# Patient Record
Sex: Female | Born: 1955 | Race: Black or African American | Hispanic: No | Smoking: Never smoker
Health system: Southern US, Community
[De-identification: ages and names within clinical notes are randomized; demographics above are authoritative.]

## PROBLEM LIST (undated history)

## (undated) DIAGNOSIS — E049 Nontoxic goiter, unspecified: Secondary | ICD-10-CM

## (undated) DIAGNOSIS — E079 Disorder of thyroid, unspecified: Secondary | ICD-10-CM

## (undated) DIAGNOSIS — R011 Cardiac murmur, unspecified: Secondary | ICD-10-CM

## (undated) DIAGNOSIS — R2240 Localized swelling, mass and lump, unspecified lower limb: Secondary | ICD-10-CM

## (undated) DIAGNOSIS — N915 Oligomenorrhea, unspecified: Secondary | ICD-10-CM

## (undated) HISTORY — DX: Disorder of thyroid, unspecified: E07.9

## (undated) HISTORY — DX: Nontoxic goiter, unspecified: E04.9

## (undated) HISTORY — PX: LIPOMA EXCISION: SHX5283

## (undated) HISTORY — DX: Oligomenorrhea, unspecified: N91.5

## (undated) HISTORY — DX: Cardiac murmur, unspecified: R01.1

## (undated) HISTORY — PX: TUBAL LIGATION: SHX77

## (undated) HISTORY — PX: WISDOM TOOTH EXTRACTION: SHX21

## (undated) HISTORY — DX: Localized swelling, mass and lump, unspecified lower limb: R22.40

---

## 2000-03-11 ENCOUNTER — Other Ambulatory Visit: Admission: RE | Admit: 2000-03-11 | Discharge: 2000-03-11 | Payer: Self-pay | Admitting: *Deleted

## 2001-06-04 ENCOUNTER — Other Ambulatory Visit: Admission: RE | Admit: 2001-06-04 | Discharge: 2001-06-04 | Payer: Self-pay | Admitting: Obstetrics and Gynecology

## 2002-07-05 ENCOUNTER — Other Ambulatory Visit: Admission: RE | Admit: 2002-07-05 | Discharge: 2002-07-05 | Payer: Self-pay | Admitting: Obstetrics and Gynecology

## 2003-07-07 ENCOUNTER — Other Ambulatory Visit: Admission: RE | Admit: 2003-07-07 | Discharge: 2003-07-07 | Payer: Self-pay | Admitting: Obstetrics and Gynecology

## 2005-10-15 ENCOUNTER — Other Ambulatory Visit: Admission: RE | Admit: 2005-10-15 | Discharge: 2005-10-15 | Payer: Self-pay | Admitting: Nephrology

## 2006-10-21 ENCOUNTER — Other Ambulatory Visit: Admission: RE | Admit: 2006-10-21 | Discharge: 2006-10-21 | Payer: Self-pay | Admitting: Nephrology

## 2012-05-13 ENCOUNTER — Other Ambulatory Visit: Payer: Self-pay | Admitting: Nephrology

## 2012-05-13 ENCOUNTER — Encounter: Payer: Self-pay | Admitting: Obstetrics and Gynecology

## 2012-05-13 ENCOUNTER — Ambulatory Visit (INDEPENDENT_AMBULATORY_CARE_PROVIDER_SITE_OTHER): Payer: BC Managed Care – PPO | Admitting: Obstetrics and Gynecology

## 2012-05-13 VITALS — BP 98/70 | HR 74 | Ht 65.5 in | Wt 154.0 lb

## 2012-05-13 DIAGNOSIS — E049 Nontoxic goiter, unspecified: Secondary | ICD-10-CM

## 2012-05-13 DIAGNOSIS — Z124 Encounter for screening for malignant neoplasm of cervix: Secondary | ICD-10-CM

## 2012-05-13 DIAGNOSIS — Z01419 Encounter for gynecological examination (general) (routine) without abnormal findings: Secondary | ICD-10-CM

## 2012-05-13 NOTE — Progress Notes (Signed)
Subjective:    Patricia Carson is a 56 y.o. female G7P5 who presents for annual exam. The patient has no complaints today.     Review of Systems Gastrointestinal:No change in bowel habits, no abdominal pain, no rectal bleeding Genitourinary:negative for abnormal vaginal bleeding,  dysuria, frequency, hematuria, nocturia and urinary incontinence.   Objective:     BP 98/70  Pulse 74  Ht 5' 5.5" (1.664 m)  Wt 154 lb (69.854 kg)  BMI 25.24 kg/m2 Weight:  Wt Readings from Last 1 Encounters:  05/13/12 154 lb (69.854 kg)   Body mass index is 25.24 kg/(m^2). General Appearance:  Well nourished in no acute distress HEENT: Grossly normal Neck / Thyroid: Supple, non-tender goiter Lungs: Clear to auscultation bilaterally Back: No CVA tenderness Breast Exam: No masses or nodes.No dimpling, nipple retraction or discharge. Cardiovascular: Regular rate and rhythm.  Gastrointestinal: Soft, non-tender, no masses or organomegaly Pelvic Exam: EGBUS-atrophic without lesions, cervix-no tenderness or lesions, uterus-appears normal size, shape and consistency, adnexae-no masses or tenderness Rectovaginal: Normal sphincter tone and  no masses  Lymphatic Exam: Non-palpable nodes in neck, clavicular, axillary, or inguinal regions Skin: No rash or abnormalities Extremities: no clubbing cyanosis or edema Neurologic: Grossly normal Psychiatric: Alert and oriented x 3    Assessment:   Routine GYN Exam  Goiter-Stable   Plan:   PAP sent  RTO 1 year or prn    Trust Leh,[email or web address]

## 2012-05-13 NOTE — Progress Notes (Signed)
Regular Periods: no Mammogram: yes  Monthly Breast Ex.: yes Exercise: yes  Tetanus < 10 years: yes Seatbelts: yes  NI. Bladder Functn.: yes Abuse at home: no  Daily BM's: yes Stressful Work: no  Healthy Diet: yes Sigmoid-Colonoscopy: 10/12  Calcium: no Medical problems this year: NO PROBLEMS   LAST PAP:7/12  NL  Contraception: BTL  Mammogram:  5/13   NL  PCP: DR. Bascom Levels  PMH: NO CHANGE  FMH: NO CHANGE  Last Bone Scan: NO  PT IS MARRIED

## 2012-05-14 LAB — PAP IG W/ RFLX HPV ASCU

## 2013-04-08 ENCOUNTER — Other Ambulatory Visit: Payer: Self-pay | Admitting: Physician Assistant

## 2013-04-08 DIAGNOSIS — Z1231 Encounter for screening mammogram for malignant neoplasm of breast: Secondary | ICD-10-CM

## 2013-07-06 DIAGNOSIS — E039 Hypothyroidism, unspecified: Secondary | ICD-10-CM | POA: Insufficient documentation

## 2013-07-06 DIAGNOSIS — E010 Iodine-deficiency related diffuse (endemic) goiter: Secondary | ICD-10-CM | POA: Insufficient documentation

## 2013-07-06 DIAGNOSIS — E119 Type 2 diabetes mellitus without complications: Secondary | ICD-10-CM | POA: Insufficient documentation

## 2013-09-06 ENCOUNTER — Ambulatory Visit: Payer: Self-pay

## 2014-07-03 ENCOUNTER — Encounter: Payer: Self-pay | Admitting: Obstetrics and Gynecology

## 2015-06-14 ENCOUNTER — Other Ambulatory Visit: Payer: Self-pay | Admitting: Endocrinology

## 2015-06-14 DIAGNOSIS — E049 Nontoxic goiter, unspecified: Secondary | ICD-10-CM

## 2015-06-21 ENCOUNTER — Ambulatory Visit
Admission: RE | Admit: 2015-06-21 | Discharge: 2015-06-21 | Disposition: A | Discharge status: Home / Self Care | Payer: BLUE CROSS/BLUE SHIELD | Source: Ambulatory Visit | Attending: Endocrinology | Admitting: Endocrinology

## 2015-06-21 DIAGNOSIS — E049 Nontoxic goiter, unspecified: Secondary | ICD-10-CM

## 2015-06-27 ENCOUNTER — Other Ambulatory Visit: Payer: Self-pay | Admitting: Endocrinology

## 2015-06-27 DIAGNOSIS — E041 Nontoxic single thyroid nodule: Secondary | ICD-10-CM

## 2015-07-04 ENCOUNTER — Inpatient Hospital Stay: Admission: RE | Admit: 2015-07-04 | Payer: BLUE CROSS/BLUE SHIELD | Source: Ambulatory Visit

## 2015-07-04 ENCOUNTER — Other Ambulatory Visit: Payer: Self-pay

## 2015-07-05 ENCOUNTER — Other Ambulatory Visit (HOSPITAL_COMMUNITY)
Admission: RE | Admit: 2015-07-05 | Discharge: 2015-07-05 | Disposition: A | Discharge status: Home / Self Care | Payer: BLUE CROSS/BLUE SHIELD | Source: Ambulatory Visit | Attending: Endocrinology | Admitting: Endocrinology

## 2015-07-05 ENCOUNTER — Other Ambulatory Visit (HOSPITAL_COMMUNITY)
Admission: RE | Admit: 2015-07-05 | Discharge: 2015-07-05 | Disposition: A | Discharge status: Home / Self Care | Payer: BLUE CROSS/BLUE SHIELD | Source: Ambulatory Visit | Attending: Radiology | Admitting: Radiology

## 2015-07-05 ENCOUNTER — Ambulatory Visit
Admission: RE | Admit: 2015-07-05 | Discharge: 2015-07-05 | Disposition: A | Discharge status: Home / Self Care | Payer: BLUE CROSS/BLUE SHIELD | Source: Ambulatory Visit | Attending: Endocrinology | Admitting: Endocrinology

## 2015-07-05 DIAGNOSIS — E041 Nontoxic single thyroid nodule: Secondary | ICD-10-CM | POA: Insufficient documentation

## 2016-05-08 ENCOUNTER — Other Ambulatory Visit: Payer: Self-pay | Admitting: Endocrinology

## 2016-05-08 DIAGNOSIS — E049 Nontoxic goiter, unspecified: Secondary | ICD-10-CM

## 2016-05-16 ENCOUNTER — Ambulatory Visit
Admission: RE | Admit: 2016-05-16 | Discharge: 2016-05-16 | Disposition: A | Discharge status: Home / Self Care | Payer: BLUE CROSS/BLUE SHIELD | Source: Ambulatory Visit | Attending: Endocrinology | Admitting: Endocrinology

## 2016-05-16 DIAGNOSIS — E049 Nontoxic goiter, unspecified: Secondary | ICD-10-CM

## 2016-09-09 ENCOUNTER — Ambulatory Visit: Payer: Self-pay | Admitting: Surgery

## 2016-10-10 ENCOUNTER — Ambulatory Visit: Payer: BLUE CROSS/BLUE SHIELD | Admitting: Family Medicine

## 2016-10-10 ENCOUNTER — Ambulatory Visit (INDEPENDENT_AMBULATORY_CARE_PROVIDER_SITE_OTHER): Payer: BLUE CROSS/BLUE SHIELD | Admitting: Family Medicine

## 2016-10-10 ENCOUNTER — Encounter: Payer: Self-pay | Admitting: Family Medicine

## 2016-10-10 VITALS — BP 110/78 | HR 70 | Resp 12 | Ht 65.5 in | Wt 147.5 lb

## 2016-10-10 DIAGNOSIS — E049 Nontoxic goiter, unspecified: Secondary | ICD-10-CM | POA: Diagnosis not present

## 2016-10-10 DIAGNOSIS — R011 Cardiac murmur, unspecified: Secondary | ICD-10-CM

## 2016-10-10 DIAGNOSIS — E119 Type 2 diabetes mellitus without complications: Secondary | ICD-10-CM | POA: Diagnosis not present

## 2016-10-10 NOTE — Progress Notes (Signed)
Pre visit review using our clinic review tool, if applicable. No additional management support is needed unless otherwise documented below in the visit note. 

## 2016-10-10 NOTE — Patient Instructions (Signed)
A few things to remember from today's visit:   Type 2 diabetes mellitus without complication, without long-term current use of insulin (HCC)  Goiter  You can continue following with Dr Talmage NapBalan in 05/2017 and I will see you in 10/2017, so you are following for diabetes q 6 months.  Aspirin 81 mg. Medications like Lisinopril for kidney protection and statins for cardiovascular protection are recommended for patients with diabetes, so may be considered later.   Please be sure medication list is accurate. If a new problem present, please set up appointment sooner than planned today.

## 2016-10-10 NOTE — Progress Notes (Signed)
HPI:   [name] is a 61 y.o. female, who is here today as a new patients. She tells me that she needs a "second opinion" on goiter.   She is patient of  Dr Dareen PianoAnderson, who she saw 12/221/2017 for her routine CPE. According to pt, it was her husband who arranged this appt.  She also follows with gyn for her routine gyn preventive care.  -According to pt, she saw general surgeon earlier this year and was told that surgery was not medically necessary but it was "up to me." She denies dysphagia,odynophagia,limitation cervical ROM, cervicalgia,or stridor. It seems to be stable in regard to size. TSH 08/21/16 normal at 0.7.  Thyroid U/S done 05/17/16:  1. Stable 3 cm inferior right solid and cystic nodule. This was previously sampled and demonstrated benign cytology. This nodule does not meet TI-RADS criteria for biopsy or imaging follow-up. 2. No reproducible 4 cm right mid thyroid nodule. This was likely a pseudo nodule on prior ultrasound. 3. Additional measured 3 cm right mid thyroid nodule on today's study, not previously measured. This may also represent a pseudo nodule and follow-up ultrasound is recommended in 12 months.  07/05/15 Fine needle Bx of thyroid nodule: Benign follicular nodule.  She tells me that her sister also had same problem and she underwent thyroidectomy.   -She has Hx of DM II, she is currently on Metformin 500 mg bid. Last HgA1C 08/2016 was 6.3. Denies severe/frequent headache, visual changes, chest pain, dyspnea, palpitation, claudication, focal weakness, or edema.  She does not exercise regularly but is active in general. She tries to follow a healthy diet. She does not check her BS's regularly.  Tolerating medications well. She denies abdominal pain, nausea, vomiting, polydipsia, polyuria, or polyphagia. No numbness, tingling, or burning.Occasinally "pin" sensation right calf first thing in the morning while she is still in bed, last a few  seconds.   She sees Dr Talmage NapBalan, endocrinologists, annually, next appt 05/2017.   Review of Systems  Constitutional: Negative for appetite change, fatigue, fever and unexpected weight change.  HENT: Negative for mouth sores, nosebleeds, sore throat, trouble swallowing and voice change.   Eyes: Negative for pain, redness and visual disturbance.  Respiratory: Negative for cough, shortness of breath, wheezing and stridor.   Cardiovascular: Negative for chest pain, palpitations and leg swelling.  Gastrointestinal: Negative for abdominal pain, nausea and vomiting.       Negative for changes in bowel habits.  Endocrine: Negative for cold intolerance and heat intolerance.  Genitourinary: Negative for decreased urine volume, dysuria and hematuria.  Musculoskeletal: Negative for gait problem and myalgias.  Skin: Negative for rash.  Neurological: Negative for syncope, weakness, numbness and headaches.  Psychiatric/Behavioral: Negative for confusion. The patient is not nervous/anxious.       Current Outpatient Prescriptions on File Prior to Visit  Medication Sig Dispense Refill  . metFORMIN (GLUCOPHAGE) 500 MG tablet TAKE TWO TABLETS BY MOUTH IN THE MORNING WITH A MEAL AND TWO TABLETS IN THE EVENING MEAL     No current facility-administered medications on file prior to visit.      Past Medical History:  Diagnosis Date  . Goiter   . Goiter   . Mass of thigh    left  . Murmur   . Oligomenorrhea   . Thyroid disease    thyromegaly    No Known Allergies  No family history on file.  Social History   Social History  . Marital status:  Divorced    Spouse name: N/A  . Number of children: N/A  . Years of education: N/A   Social History Main Topics  . Smoking status: Never Smoker  . Smokeless tobacco: Never Used  . Alcohol use No  . Drug use: No  . Sexual activity: Yes    Birth control/ protection: Surgical     Comment: BTL   Other Topics Concern  . None   Social History  Narrative  . None    Vitals:   10/10/16 1032  BP: 110/78  Pulse: 70  Resp: 12   O2 sat 98% at RA. Body mass index is 24.17 kg/m.   Physical Exam  Nursing note and vitals reviewed. Constitutional: She is oriented to person, place, and time. She appears well-developed and well-nourished. No distress.  HENT:  Head: Atraumatic.  Mouth/Throat: Oropharynx is clear and moist and mucous membranes are normal.  Eyes: Conjunctivae and EOM are normal. Pupils are equal, round, and reactive to light.  Neck: No tracheal deviation present. Thyromegaly present.  Cardiovascular: Normal rate and regular rhythm.   Murmur (SEM I/VI RUSB and LUSB) heard. Pulses:      Dorsalis pedis pulses are 2+ on the right side, and 2+ on the left side.  Respiratory: Effort normal and breath sounds normal. No respiratory distress.  GI: Soft. She exhibits no mass. There is no hepatomegaly. There is no tenderness.  Musculoskeletal: She exhibits no edema or tenderness.  Lymphadenopathy:    She has no cervical adenopathy.  Neurological: She is alert and oriented to person, place, and time. She has normal strength. Coordination and gait normal.  Skin: Skin is warm. No erythema.  Psychiatric: She has a normal mood and affect.  Well groomed, good eye contact.      ASSESSMENT AND PLAN:   Patricia Carson was seen today for establish care.  Diagnoses and all orders for this visit:  Goiter  We reviewed last thyroid u/s, pathology results and discussed disease, treatment ,and prognosis. She states that she would like to have thyroidectomy but can not afford the deductible. She was reassured. Monitor for growth or signs of complications. Continue following with Dr Talmage Nap.  Type 2 diabetes mellitus without complication, without long-term current use of insulin (HCC)  Well controlled. No changes in current management. Dental,eye,and foot care discussed and recommended. Continue following annually with Dr Talmage Nap and  with Dr Dareen Piano.  Heart murmur  Soft SEM was heard today, educated about possible etiologies, most likely benign.  She is not sure if she has prior Hx. She is asymptomatic. No further recommendations today, will follow with Dr Dareen Piano, instructed about warning signs.     Zephyr Sausedo G. [place], MD  [facility]. [place] office.

## 2016-12-02 IMAGING — US US THYROID
1 series · 12 of 25 positions shown · non-contrast
Comparison: 06/21/2015

CLINICAL DATA: Prior ultrasound follow-up. Multinodular thyroid
goiter. Prior needle aspirate sampling of right mid and inferior
thyroid nodules on 07/05/2015 with both demonstrating evidence of
benign follicular nodules.

EXAM:
THYROID ULTRASOUND
TECHNIQUE: Ultrasound examination of the thyroid gland and adjacent soft
tissues was performed.

[Series 1: us thyroid · 0.08mm/px · 12 of 66 slices shown]
[im 3/66]
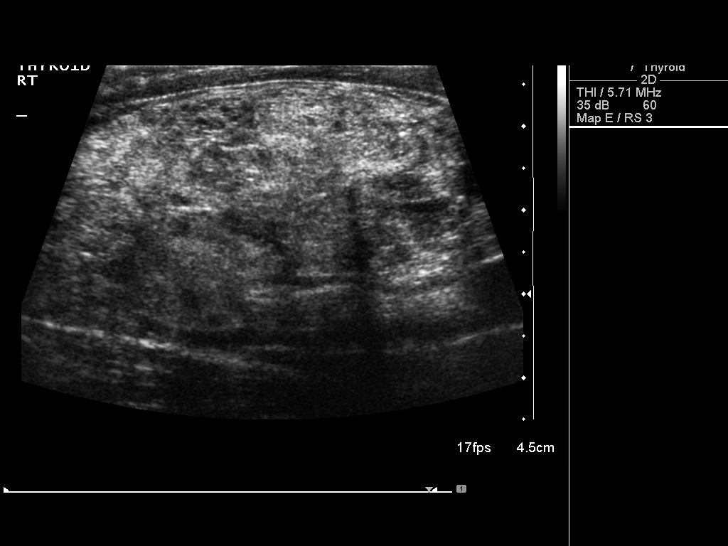
[im 9/66]
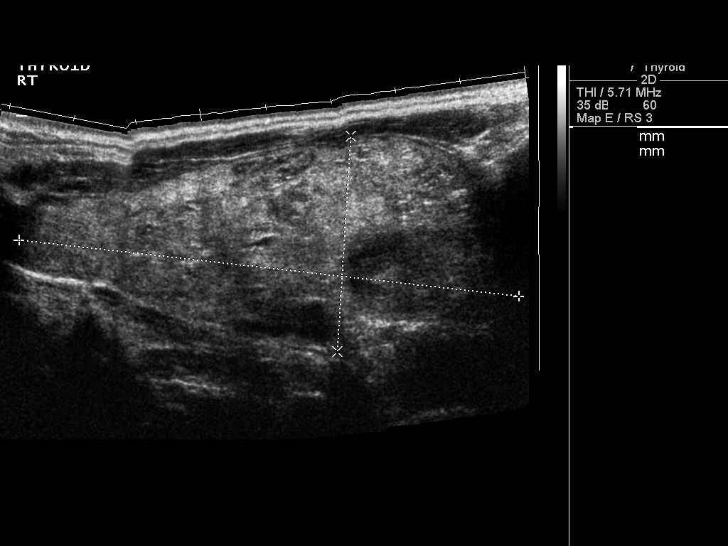
[im 14/66]
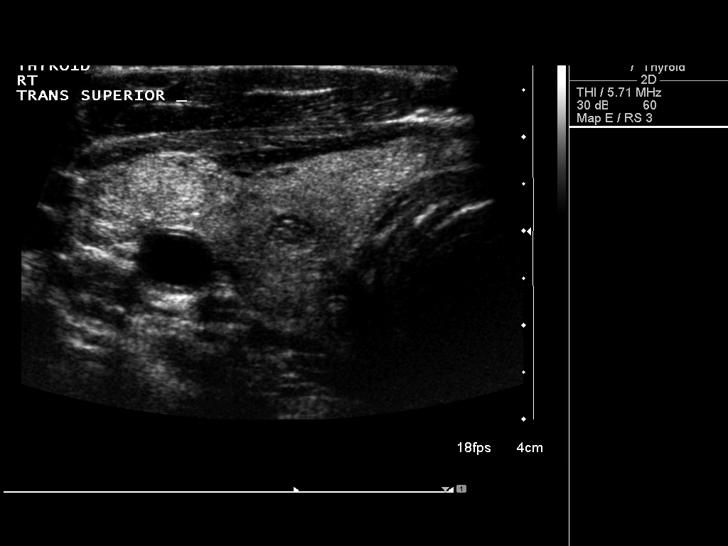
[im 19/66]
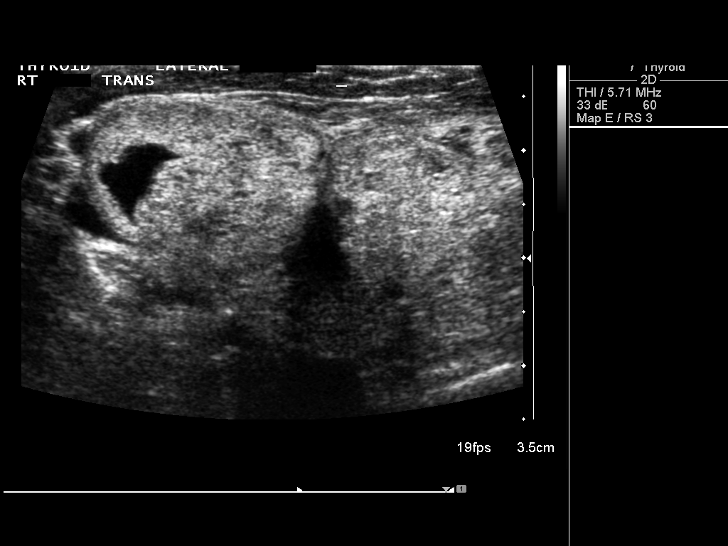
[im 25/66]
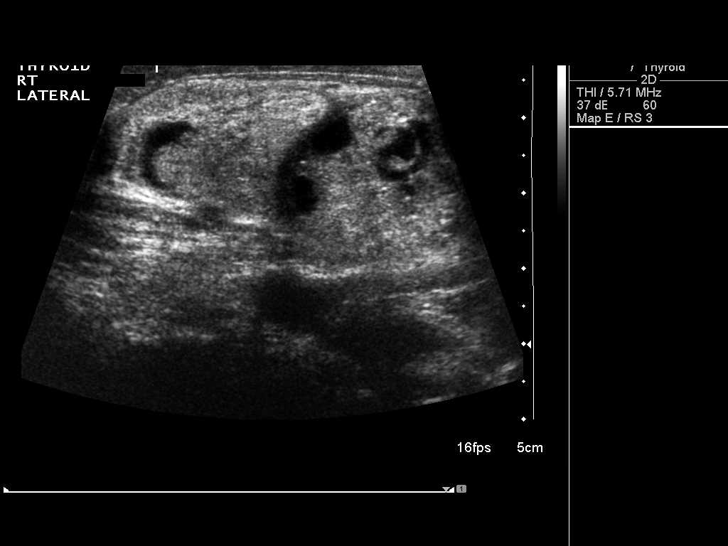
[im 30/66]
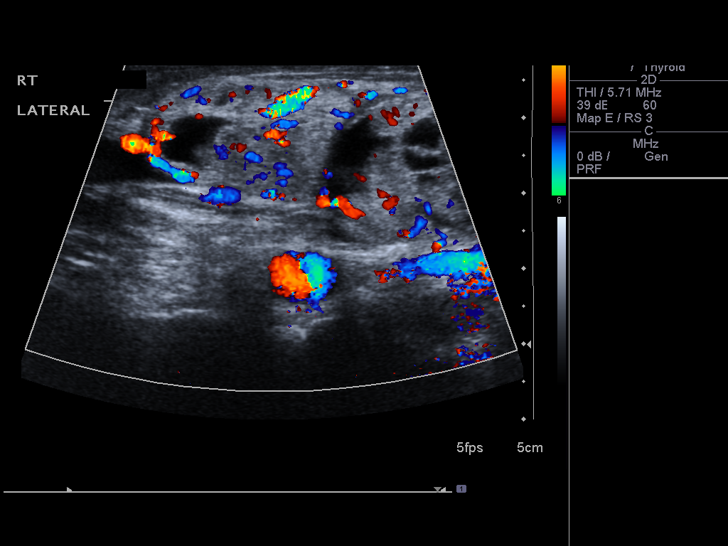
[im 36/66]
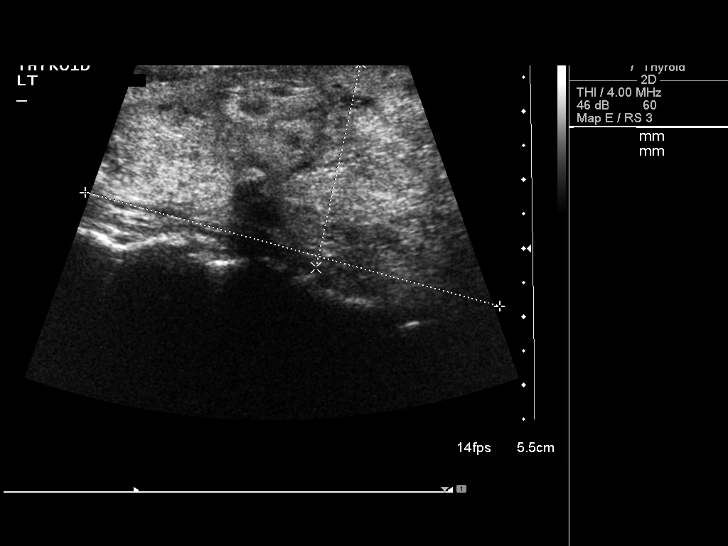
[im 41/66]
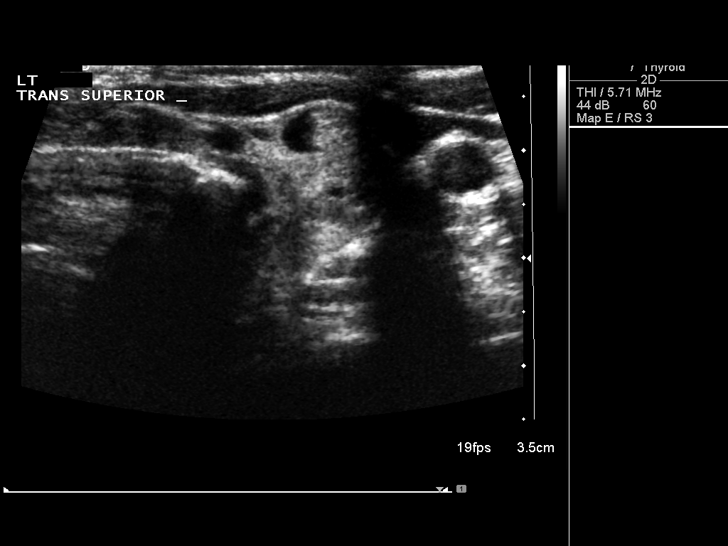
[im 47/66]
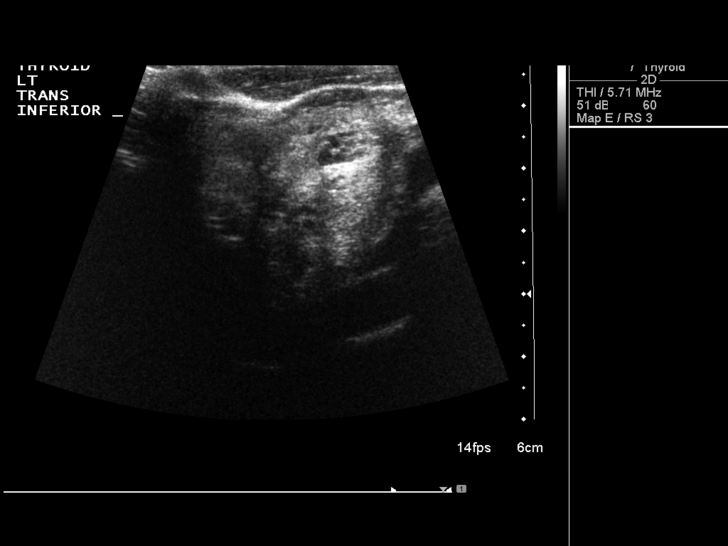
[im 52/66]
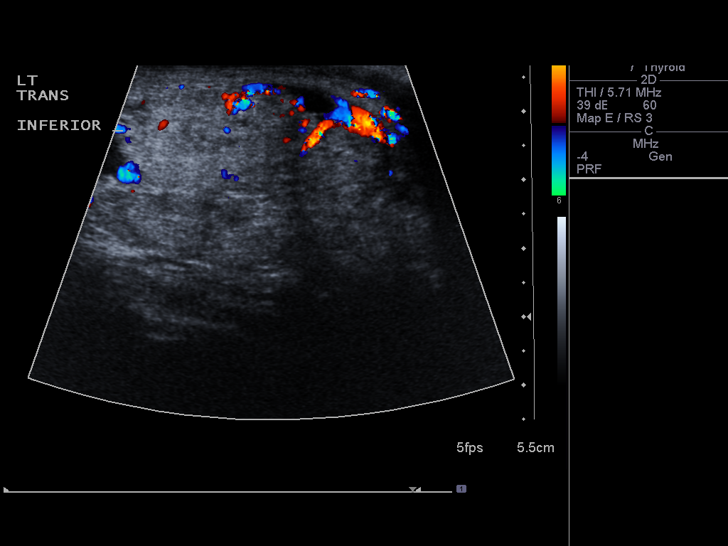
[im 57/66]
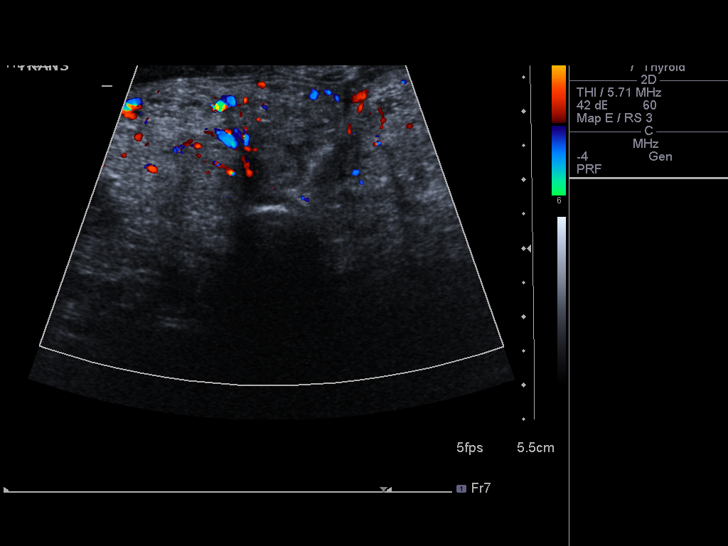
[im 63/66]
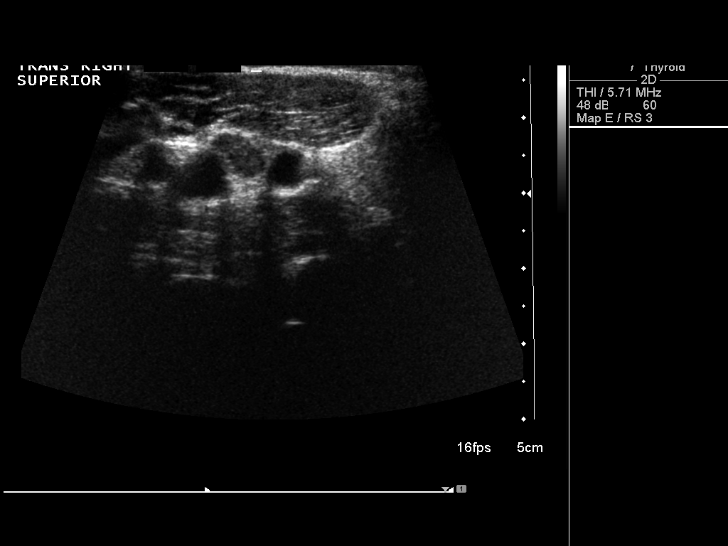

[12 of 25 positions shown; findings below may reference images not displayed]

FINDINGS: Parenchymal Echotexture: Markedly heterogenous

Estimated total number of nodules >/= 1 cm: 2

Number of spongiform nodules > 2 cm not described below (TR1): 0

Number of mixed cystic and solid nodules > 1.5 cm not described
below (TR2): 0

_________________________________________________________

Isthmus: 1.8 cm

No discrete nodules are identified within the thyroid isthmus.

_________________________________________________________

Right lobe: 9.0 x 3.1 x 5.0 cm

Nodule # 1:

Prior biopsy: Yes

Location: Right; Inferior

Size: 3.2 x 2.3 x 2.8 cm (previously 3.1 x 2.3 x 2.7 cm).

Composition: mixed cystic and solid (1)

Echogenicity: isoechoic (1)

Shape: not taller-than-wide (0)

Margins: smooth (0)

Echogenic foci: none (0)

ACR TI-RADS total points: 2.

ACR TI-RADS risk category: TR2 (2 points).

Change in features: No

Change in ACR TI-RADS risk category: No

ACR TI-RADS recommendations:

This nodule does NOT meet TI-RADS criteria for biopsy or dedicated
follow-up.

Nodule # 2:

Location: Right; Mid

Size: 3.2 x 01.6 x 2.2 cm (previously not measured)

Composition: solid/almost completely solid (2)

Echogenicity: isoechoic (1)

Shape: not taller-than-wide (0)

Margins: smooth (0)

Echogenic foci: none (0)

ACR TI-RADS total points: 3.

ACR TI-RADS risk category: TR3 (3 points).

ACR TI-RADS recommendations:

This nodule was previously not measured. Based on ultrasound
appearance, this may represent a pseudo nodule. Reasonable approach
would be a follow-up ultrasound in 12 months.

No reproducible 4 cm mid right thyroid nodule as previously
measured. The superior right lobe is heterogeneous and previous
findings are felt to relate to a pseudo nodule.

_________________________________________________________

Left lobe: 9.6 x 3.9 x 5.6 cm

Diffuse heterogeneous left lobe without discrete nodules. Focal
shadowing calcification in the superior to mid left lobe was present
previously and is consistent with dystrophic macroscopic
calcification.
IMPRESSION: 1. Stable 3 cm inferior right solid and cystic nodule. This was
previously sampled and demonstrated benign cytology. This nodule
does not meet TI-RADS criteria for biopsy or imaging follow-up.
2. No reproducible 4 cm right mid thyroid nodule. This was likely a
pseudo nodule on prior ultrasound.
3. Additional measured 3 cm right mid thyroid nodule on today's
study, not previously measured. This may also represent a pseudo
nodule and follow-up ultrasound is recommended in 12 months.
The above is in keeping with the ACR TI-RADS recommendations - [HOSPITAL] 1377;[DATE].

## 2017-05-08 ENCOUNTER — Other Ambulatory Visit: Payer: Self-pay | Admitting: Endocrinology

## 2017-05-08 DIAGNOSIS — E049 Nontoxic goiter, unspecified: Secondary | ICD-10-CM

## 2017-05-14 ENCOUNTER — Ambulatory Visit
Admission: RE | Admit: 2017-05-14 | Discharge: 2017-05-14 | Disposition: A | Discharge status: Home / Self Care | Payer: BLUE CROSS/BLUE SHIELD | Source: Ambulatory Visit | Attending: Endocrinology | Admitting: Endocrinology

## 2017-05-14 DIAGNOSIS — E049 Nontoxic goiter, unspecified: Secondary | ICD-10-CM

## 2017-05-20 ENCOUNTER — Other Ambulatory Visit: Payer: Self-pay | Admitting: Endocrinology

## 2017-05-20 DIAGNOSIS — E049 Nontoxic goiter, unspecified: Secondary | ICD-10-CM

## 2017-06-09 ENCOUNTER — Ambulatory Visit
Admission: RE | Admit: 2017-06-09 | Discharge: 2017-06-09 | Disposition: A | Discharge status: Home / Self Care | Payer: BLUE CROSS/BLUE SHIELD | Source: Ambulatory Visit | Attending: Endocrinology | Admitting: Endocrinology

## 2017-06-09 ENCOUNTER — Other Ambulatory Visit: Payer: Self-pay | Admitting: Endocrinology

## 2017-06-09 DIAGNOSIS — E049 Nontoxic goiter, unspecified: Secondary | ICD-10-CM

## 2017-12-26 IMAGING — US US THYROID
1 series · 4 of 4 positions shown · non-contrast
Comparison: Ultrasound 05/14/2017

CLINICAL DATA: Thyroid nodules. Possible TR 4 (moderately
suspicious) mid left nodule versus pseudonodule noted on recent
ultrasound. Biopsy requested.

EXAM:
THYROID ULTRASOUND LIMITED
TECHNIQUE: Directed Ultrasound examination of the left lobe of the thyroid in
contemplation of percutaneous FNA biopsy.

[Series 1: us thyroid · 4 acquisitions, 4 frames shown]
[im 1/4]
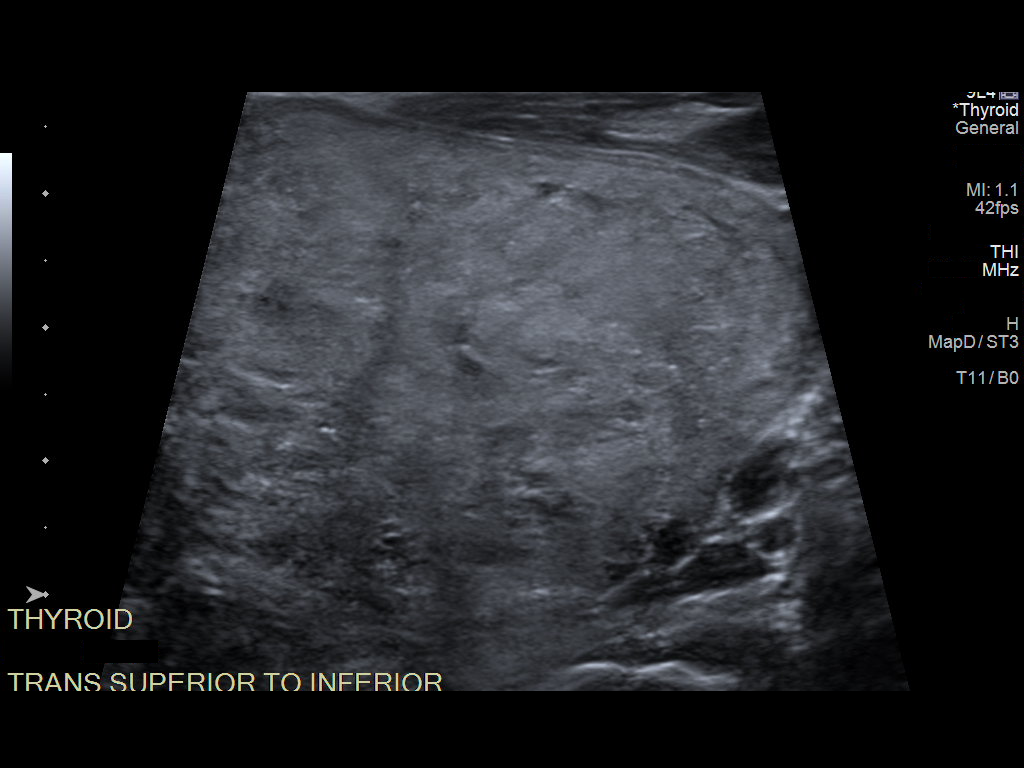
[im 2/4]
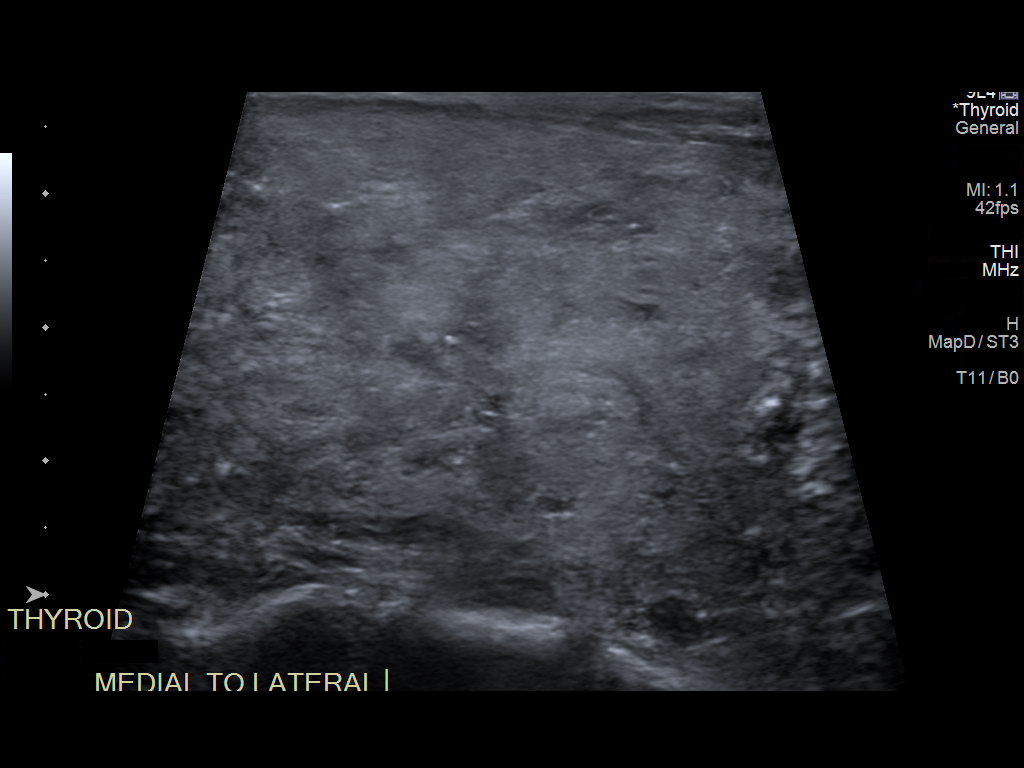
[im 3/4]
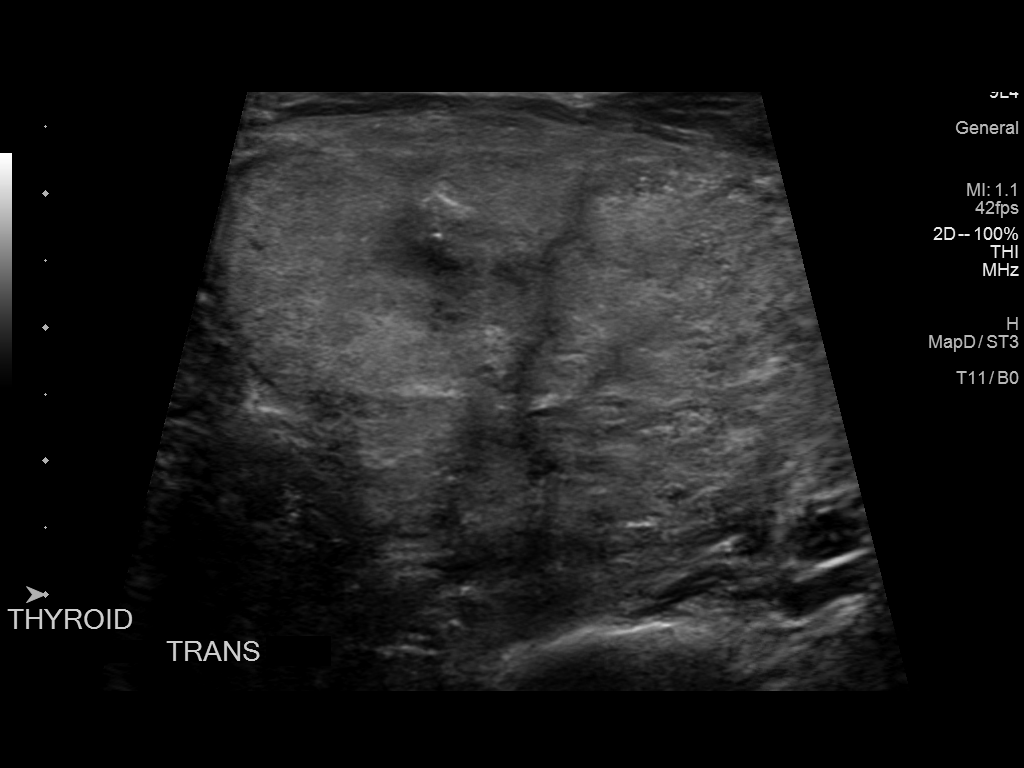
[im 4/4]
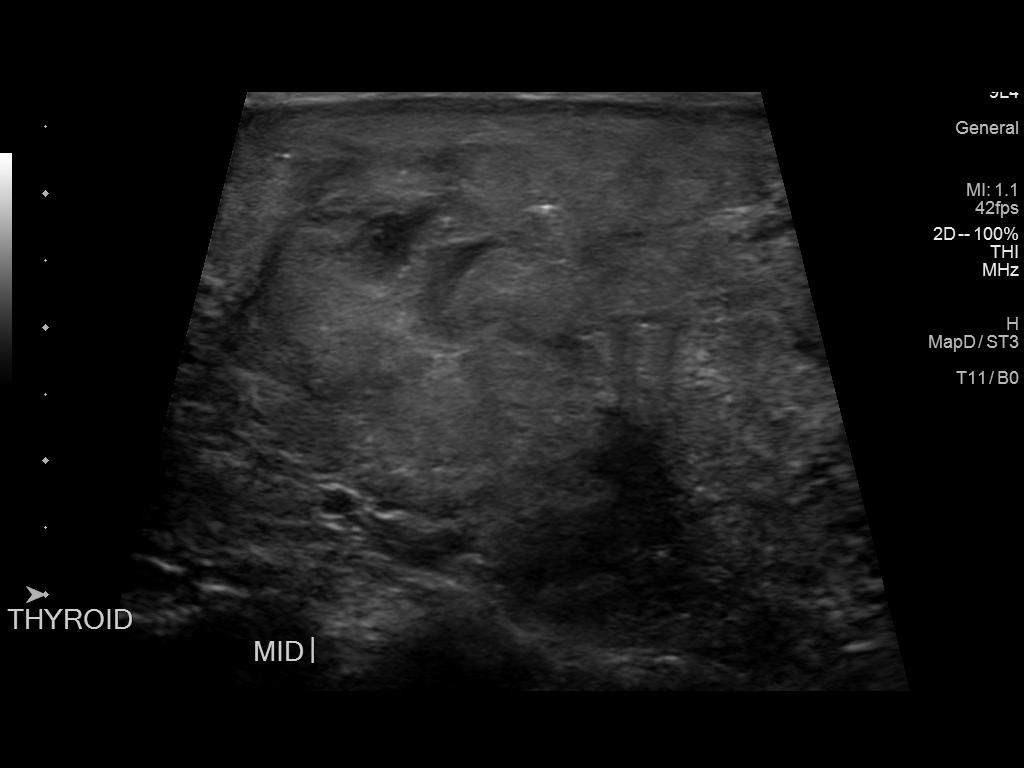

[4 of 4 positions shown; findings below may reference images not displayed]

FINDINGS: Parenchyma is markedly heterogeneous and lobular. No discrete lesion
in the mid left lobe is confirmed to correspond to the region
suspected on prior static images, consistent with pseudonodule.
Biopsy therefore deferred.
IMPRESSION: 1. No discrete mid left nodule corresponding to area of concern on
previous ultrasound. Findings probably pseudo nodule. Biopsy
deferred.

Consider 1-2 year follow-up surveillance ultrasound as previously
recommended.

The above is in keeping with the ACR TI-RADS recommendations - [HOSPITAL] 9075;[DATE].

## 2018-03-21 IMAGING — US US THYROID
1 series · 12 of 25 positions shown · non-contrast
Comparison: 07/05/2015, 05/16/2016

CLINICAL DATA: Goiter.

EXAM:
THYROID ULTRASOUND
TECHNIQUE: Ultrasound examination of the thyroid gland and adjacent soft
tissues was performed.

[Series 1: us thyroid · 0.11mm/px · 90 acquisitions, 12 frames shown]
[im 4/90]
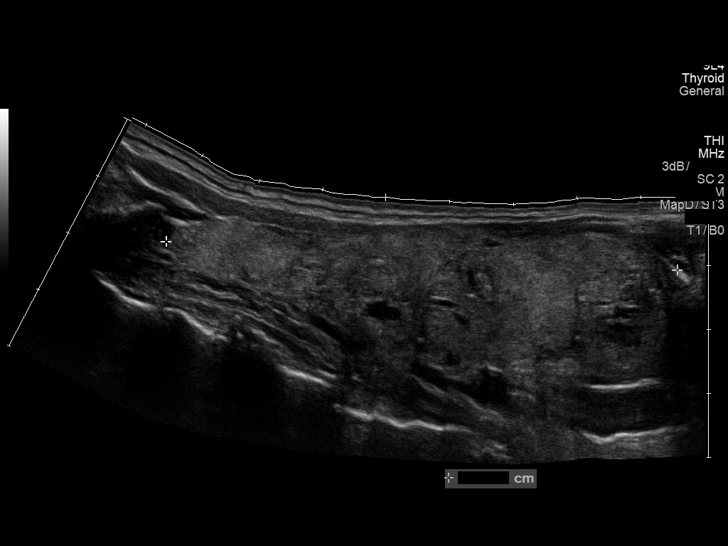
[im 12/90]
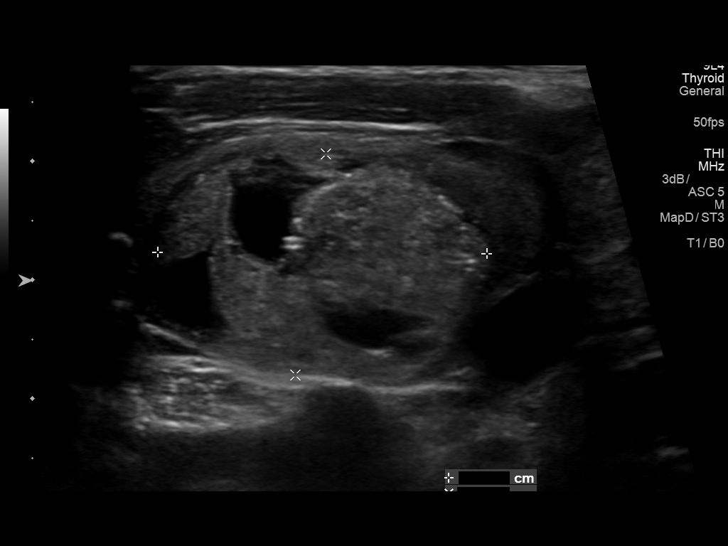
[im 19/90]
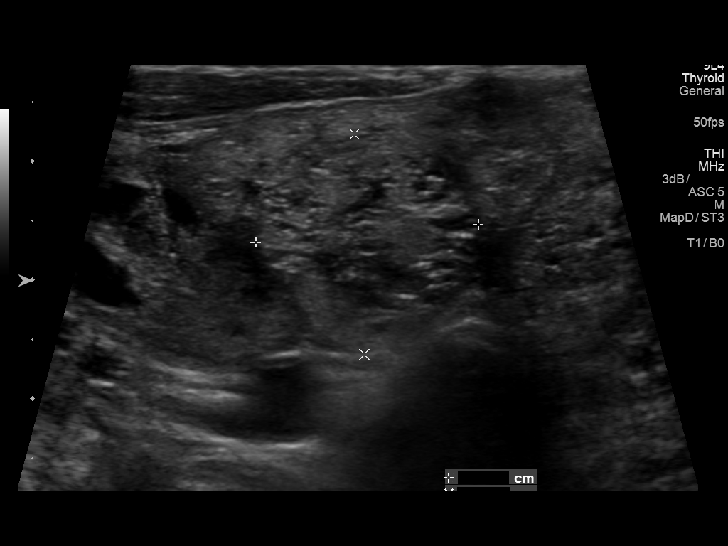
[im 26/90]
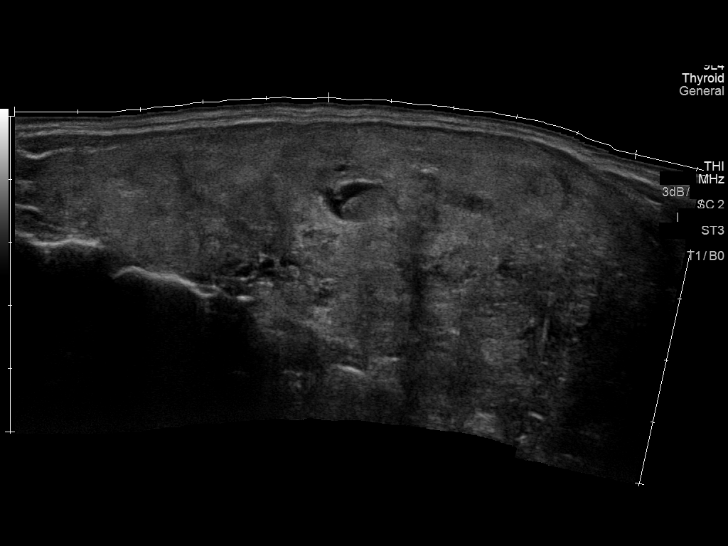
[im 34/90]
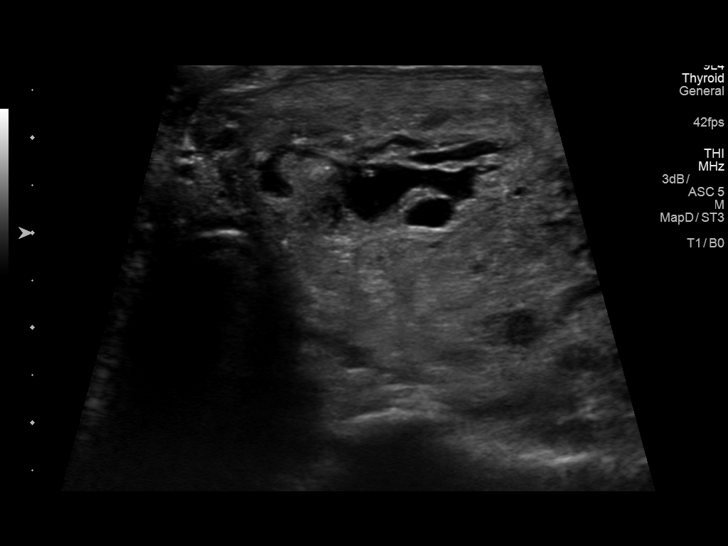
[im 41/90]
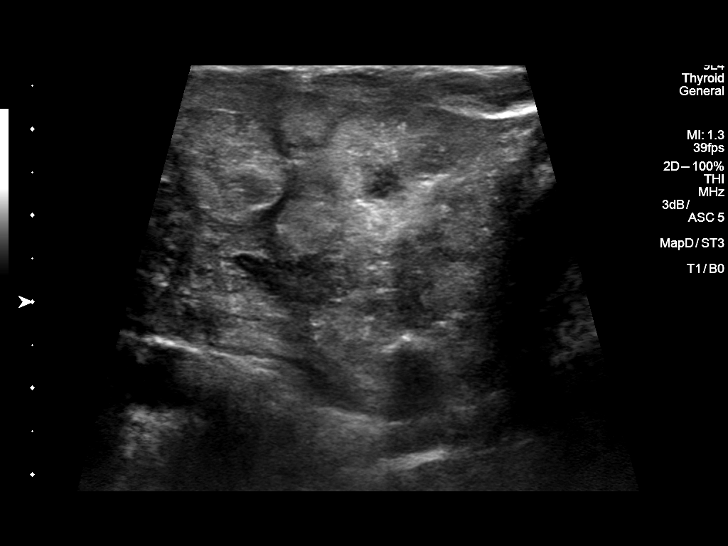
[im 49/90]
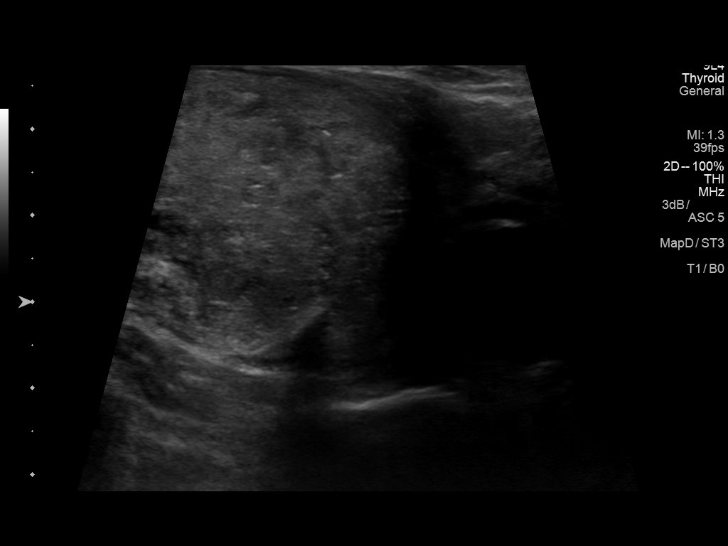
[im 56/90]
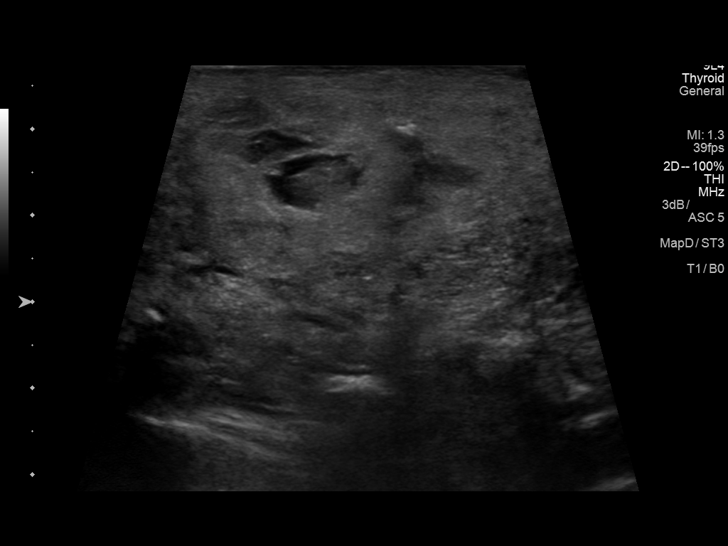
[im 64/90]
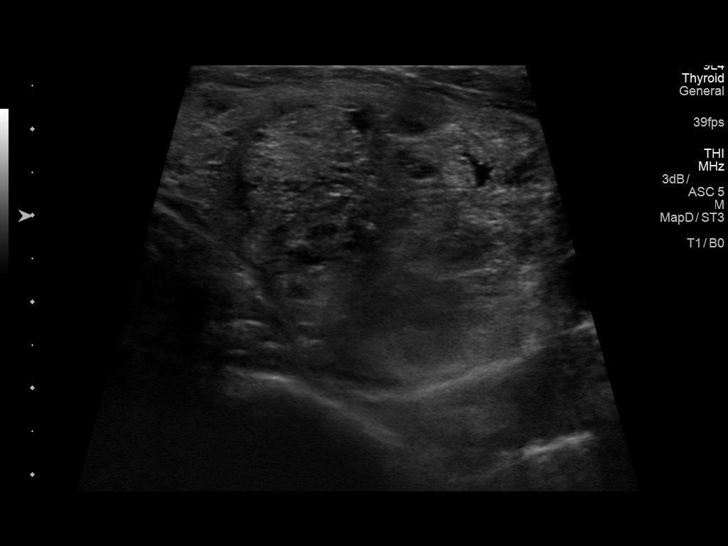
[im 71/90]
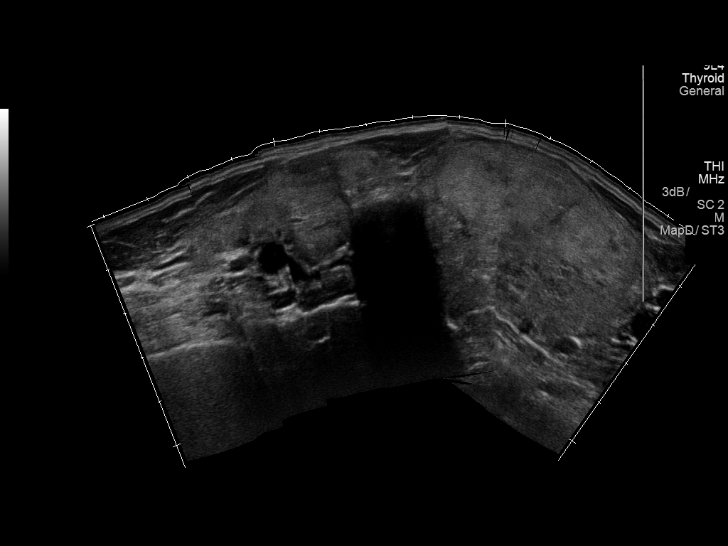
[im 78/90]
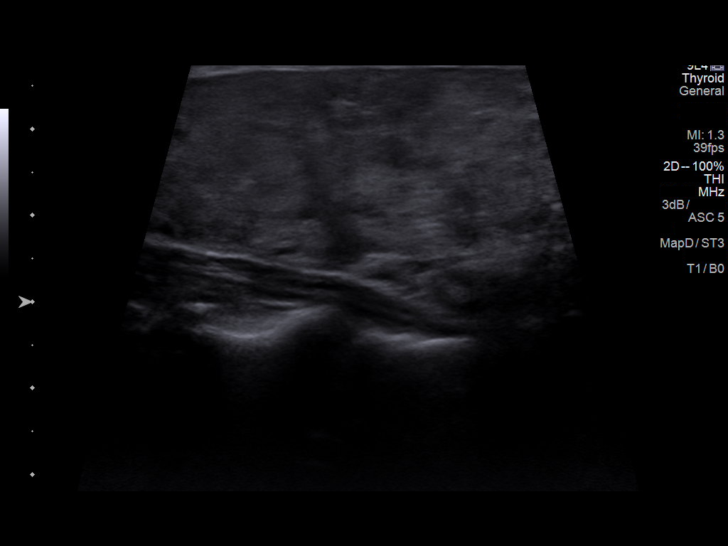
[im 86/90]
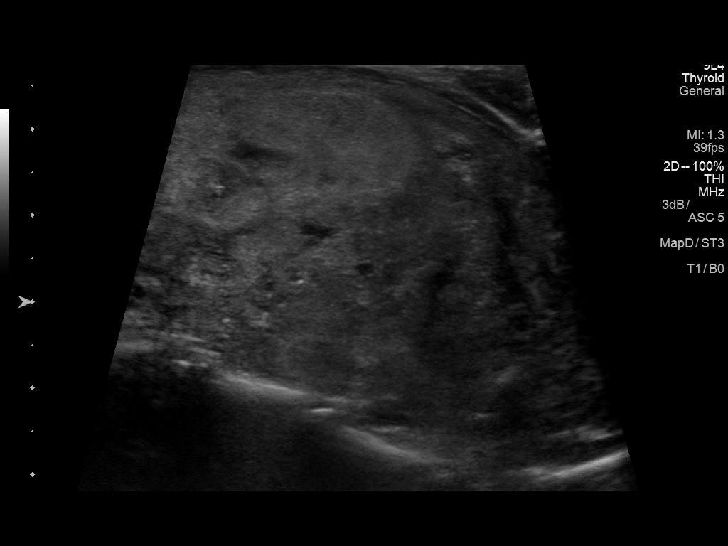

[12 of 25 positions shown; findings below may reference images not displayed]

FINDINGS: Parenchymal Echotexture: Markedly heterogenous

Isthmus: 1.7 cm, previously 1.8 cm

Right lobe: 8.0 x 2.6 x 4.5 cm, previously 9.0 x 3.1 x 5.0 cm

Left lobe: 5.6 x 4.0 x 5.0 cm, previously 5.6 x 3.9 x 5.6 cm

_________________________________________________________

Estimated total number of nodules >/= 1 cm: 4

Number of spongiform nodules >/=  2 cm not described below (TR1): 0

Number of mixed cystic and solid nodules >/= 1.5 cm not described
below (TR2): 0

_________________________________________________________

Nodule # 1:

Prior biopsy: yes, 07/05/2015

Location: Right; Mid

Maximum size: 1.9, previously 3.2 cm; Other 2 dimensions: 1.5 x
cm, previously, 1.6 x 2.2 cm

Composition: solid/almost completely solid (2)

Echogenicity: isoechoic (1)

Shape: not taller-than-wide (0)

Margins: ill-defined (0)

Echogenic foci: none (0)

ACR TI-RADS total points: 3.

ACR TI-RADS risk category:  TR3 (3 points).

Significant change in size (>/= 20% in two dimensions and minimal
increase of 2 mm): No

Change in features: No

Change in ACR TI-RADS risk category: No

ACR TI-RADS recommendations:

*Given size (>/= 1.5 - 2.4 cm) and appearance, a follow-up
ultrasound in 1 year should be considered based on TI-RADS criteria.
Correlate with prior pathology.

_________________________________________________________

Nodule # 2:

Prior biopsy: Yes, 07/05/2015

Location: Right; Inferior

Maximum size: 3.2 cm; Other 2 dimensions: 2.2 x 2.8 cm, previously,
2.3 x 2.8 cm

Composition: mixed cystic and solid (1)

Echogenicity: isoechoic (1)

Shape: not taller-than-wide (0)

Margins: ill-defined (0)

Echogenic foci: large comet-tail artifacts (0)

ACR TI-RADS total points: 2.

ACR TI-RADS risk category:  TR2 (2 points).

Significant change in size (>/= 20% in two dimensions and minimal
increase of 2 mm): No

Change in features: No

Change in ACR TI-RADS risk category: No

ACR TI-RADS recommendations:

This nodule does NOT meet TI-RADS criteria for biopsy or dedicated
follow-up.

_________________________________________________________

Nodule # 3:

Location: Left; Mid

Maximum size: 2.8 cm; Other 2 dimensions: 2.1 x 2 point cell cm

Composition: mixed cystic and solid (1)

Echogenicity: isoechoic (1)

Shape: not taller-than-wide (0)

Margins: ill-defined (0)

Echogenic foci: none (0)

ACR TI-RADS total points: 2.

ACR TI-RADS risk category: TR2 (2 points).

ACR TI-RADS recommendations:

This nodule does NOT meet TI-RADS criteria for biopsy or dedicated
follow-up.

_________________________________________________________

Nodule # 4:

Location: Left; Mid

Maximum size: 1.9 cm; Other 2 dimensions: 1.6 x 1.7 cm

Composition: solid/almost completely solid (2)

Echogenicity: isoechoic (1)

Shape: taller-than-wide (3)

Margins: ill-defined (0)

Echogenic foci: none (0)

ACR TI-RADS total points: 6.

ACR TI-RADS risk category: TR4 (4-6 points).

ACR TI-RADS recommendations:

**Given size (>/= 1.5 cm) and appearance, fine needle aspiration of
this moderately suspicious nodule should be considered based on
TI-RADS criteria.

_________________________________________________________
IMPRESSION: Grossly stable right midpole TR 3 nodule and right inferior pole TR
2 nodule. These both have been previously biopsied. Correlate with
prior pathology. Assuming benign pathology, no further imaging or
follow-up would be necessary.

2.8 cm left midpole TR 2 nodule does not meet criteria for biopsy or
follow-up

1.9 cm left midpole TR 4 nodule meets criteria for biopsy as above,
however, this may represent a pseudo nodule and therefore follow-up
could be performed in 12 months.

The above is in keeping with the ACR TI-RADS recommendations - [HOSPITAL] 0006;[DATE].

## 2018-05-12 ENCOUNTER — Other Ambulatory Visit: Payer: Self-pay | Admitting: Endocrinology

## 2018-05-12 DIAGNOSIS — E049 Nontoxic goiter, unspecified: Secondary | ICD-10-CM

## 2018-05-21 ENCOUNTER — Ambulatory Visit
Admission: RE | Admit: 2018-05-21 | Discharge: 2018-05-21 | Disposition: A | Discharge status: Home / Self Care | Payer: BLUE CROSS/BLUE SHIELD | Source: Ambulatory Visit | Attending: Endocrinology | Admitting: Endocrinology

## 2018-05-21 DIAGNOSIS — E049 Nontoxic goiter, unspecified: Secondary | ICD-10-CM

## 2019-03-28 IMAGING — US US THYROID
1 series · 13 of 25 positions shown · non-contrast
Comparison: 06/09/2017 and previous

CLINICAL DATA: Nontoxic goiter. Previous FNA biopsy of mid right
and inferior right nodules 07/05/2015.

EXAM:
THYROID ULTRASOUND
TECHNIQUE: Ultrasound examination of the thyroid gland and adjacent soft
tissues was performed.

[Series 1: us thyroid · 0.06mm/px · 46 acquisitions, 13 frames shown]
[im 1/46]
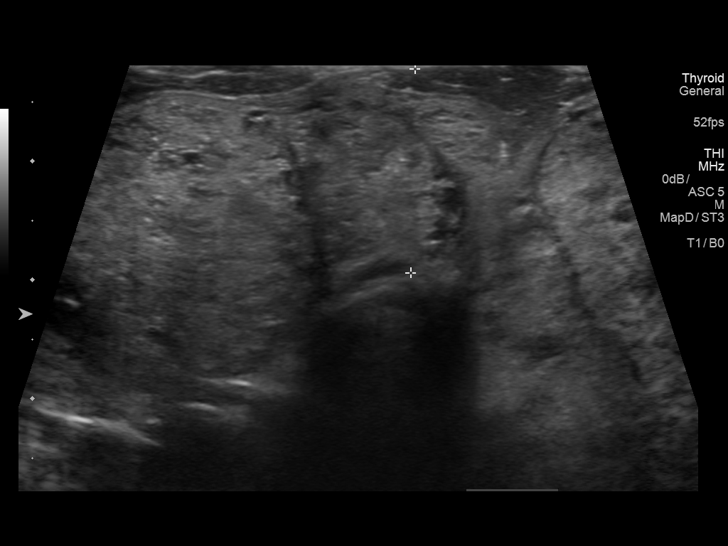
[im 4/46]
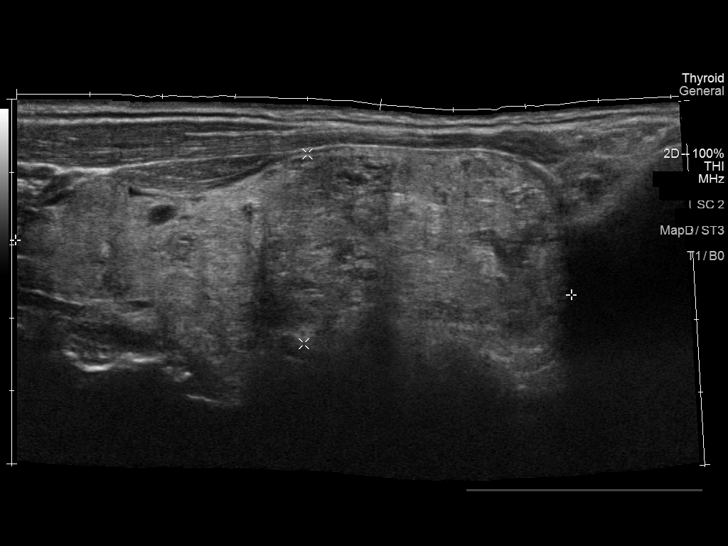
[im 8/46]
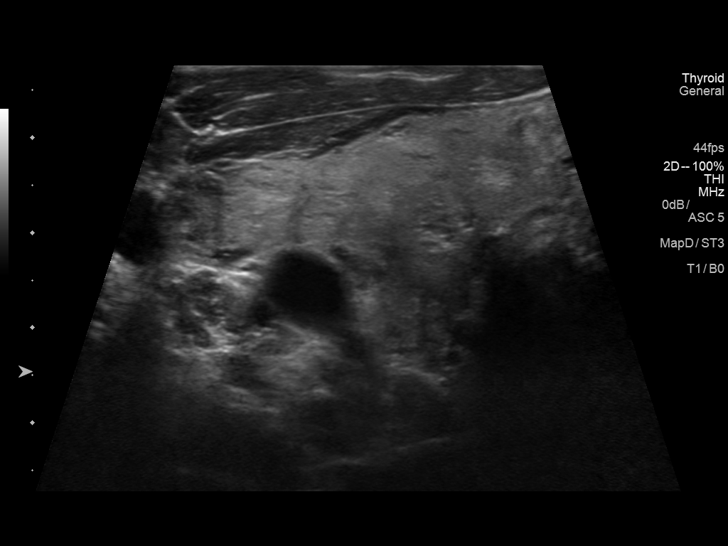
[im 12/46]
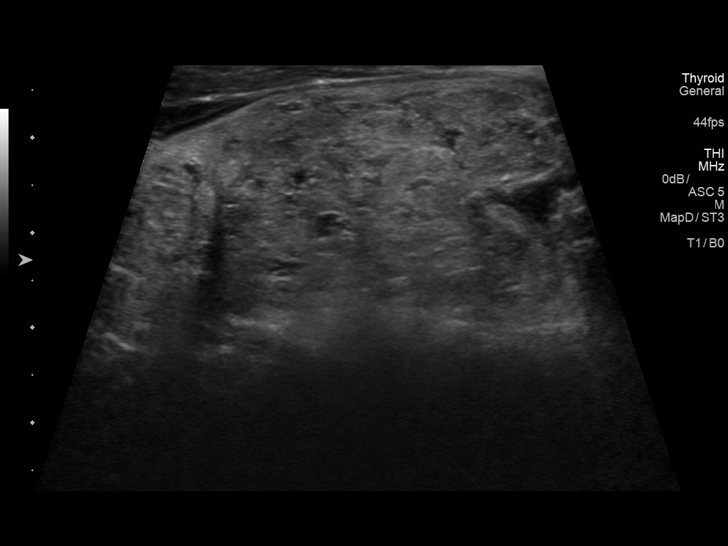
[im 16/46]
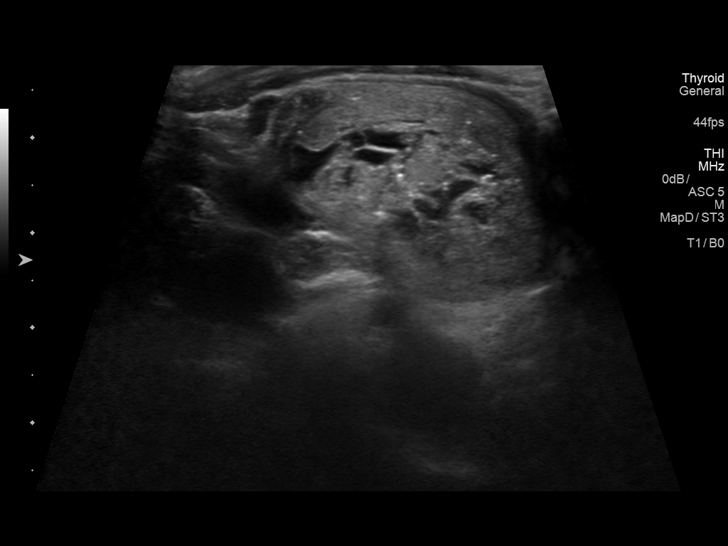
[im 19/46]
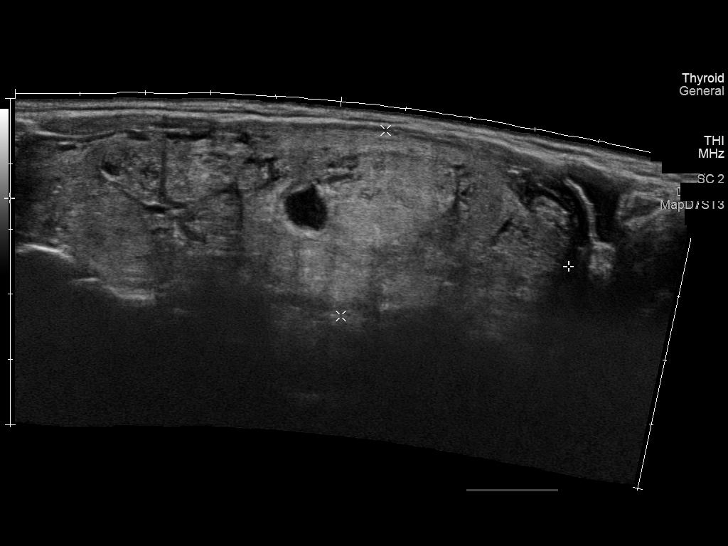
[im 23/46]
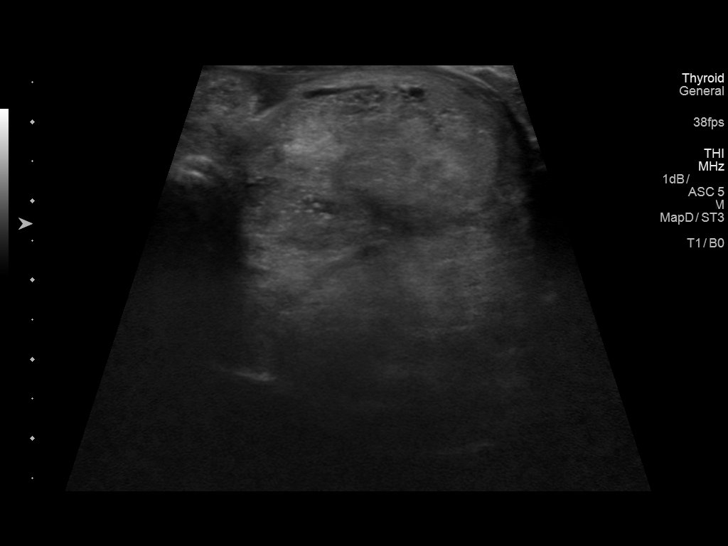
[im 27/46]
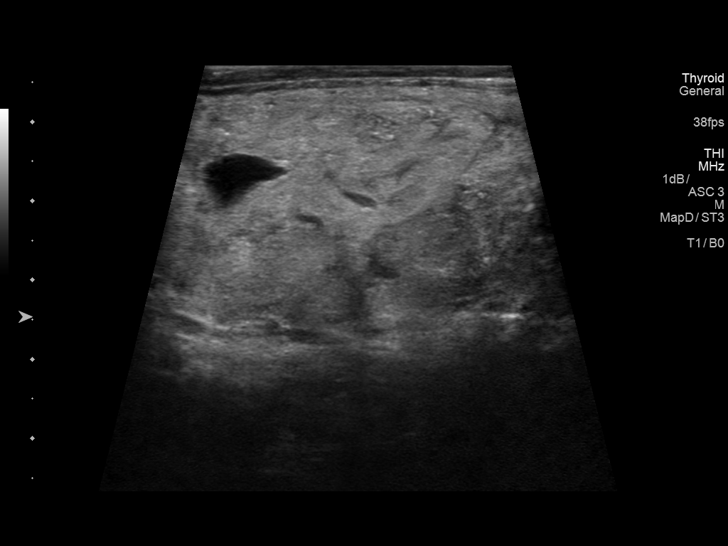
[im 31/46]
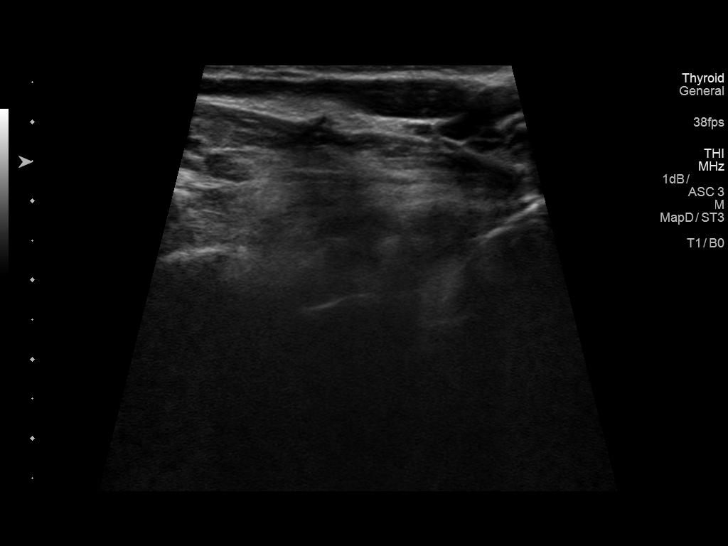
[im 34/46]
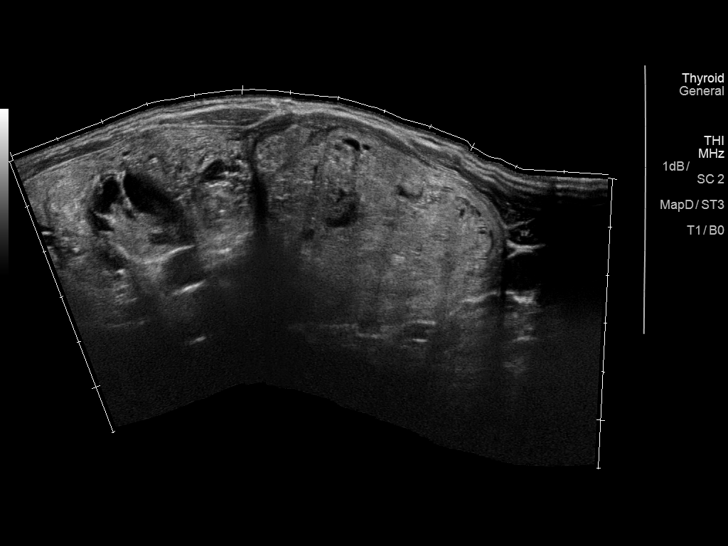
[im 38/46]
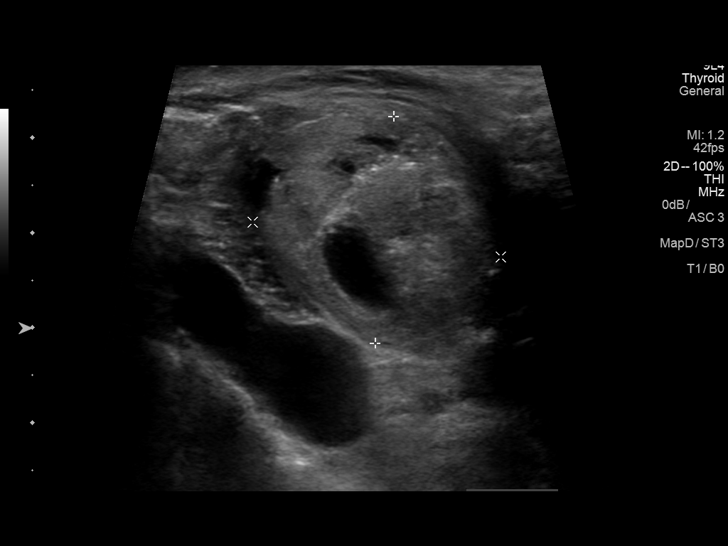
[im 42/46]
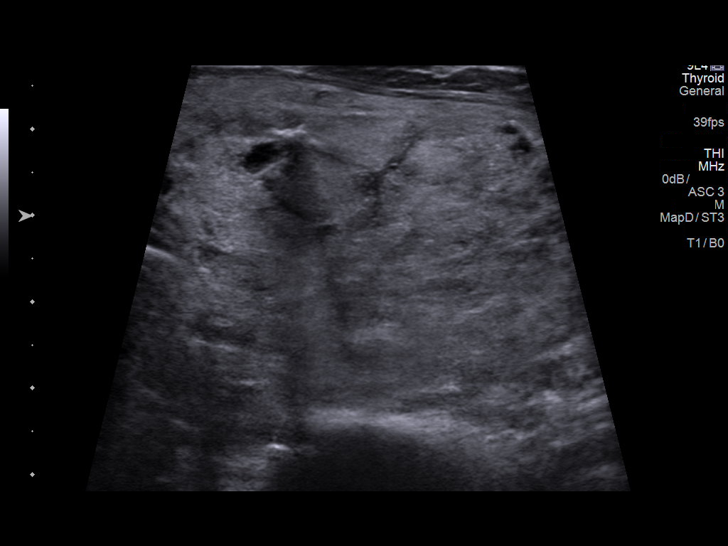
[im 46/46]
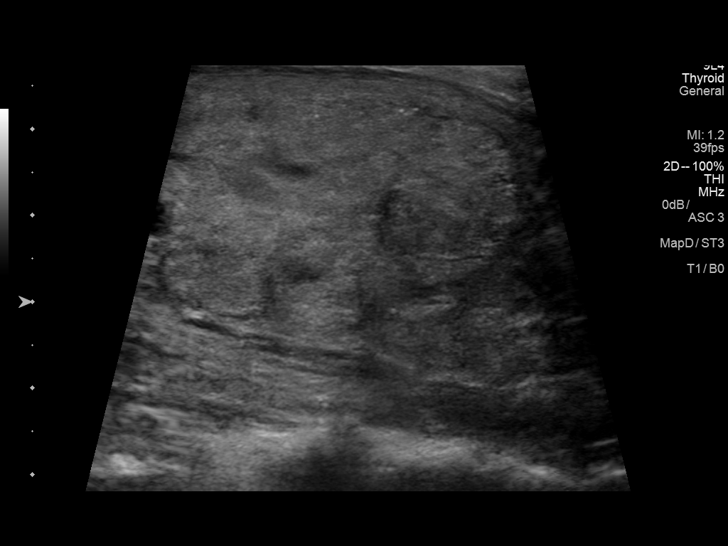

[13 of 25 positions shown; findings below may reference images not displayed]

FINDINGS: Parenchymal Echotexture: Markedly heterogenous

Isthmus: 1.7 cm thickness, stable

Right lobe: 7.6 x 2.6 x 3.3 cm, previously 8 x 2.6 x

Left lobe: 8.6 x 2.9 x 3.8 cm, previously 5.6 x 4 x 5

_________________________________________________________

Estimated total number of nodules >/= 1 cm: 1

Number of spongiform nodules >/=  2 cm not described below (TR1): 0

Number of mixed cystic and solid nodules >/= 1.5 cm not described
below (TR2): 0

_________________________________________________________

3.5 x 2.4 x 2.6 cm Mid right nodule with calcifications, previously
3.2 x 2.2 x 2.8; this was previously biopsied

Other nodules have been described on prior studies, but NO
well-defined additional lesions are identified on today's
examination in the background of markedly heterogenous parenchyma
IMPRESSION: 1. Stable thyromegaly with markedly heterogenous parenchyma.
2. No change in size of discrete mid right nodule, previously
biopsied.

The above is in keeping with the ACR TI-RADS recommendations - [HOSPITAL] 1628;[DATE].

## 2020-02-16 ENCOUNTER — Other Ambulatory Visit: Payer: Self-pay | Admitting: Endocrinology

## 2020-02-16 DIAGNOSIS — E049 Nontoxic goiter, unspecified: Secondary | ICD-10-CM

## 2020-02-28 ENCOUNTER — Ambulatory Visit
Admission: RE | Admit: 2020-02-28 | Discharge: 2020-02-28 | Disposition: A | Discharge status: Home / Self Care | Payer: BLUE CROSS/BLUE SHIELD | Source: Ambulatory Visit | Attending: Endocrinology | Admitting: Endocrinology

## 2020-02-28 DIAGNOSIS — E049 Nontoxic goiter, unspecified: Secondary | ICD-10-CM

## 2020-05-11 ENCOUNTER — Other Ambulatory Visit: Payer: Self-pay | Admitting: Endocrinology

## 2020-05-11 DIAGNOSIS — E049 Nontoxic goiter, unspecified: Secondary | ICD-10-CM

## 2022-05-16 ENCOUNTER — Other Ambulatory Visit: Payer: Self-pay | Admitting: Endocrinology

## 2022-05-16 DIAGNOSIS — E049 Nontoxic goiter, unspecified: Secondary | ICD-10-CM

## 2022-05-21 ENCOUNTER — Ambulatory Visit
Admission: RE | Admit: 2022-05-21 | Discharge: 2022-05-21 | Disposition: A | Discharge status: Home / Self Care | Payer: BC Managed Care – PPO | Source: Ambulatory Visit | Attending: Endocrinology | Admitting: Endocrinology

## 2022-05-21 DIAGNOSIS — E049 Nontoxic goiter, unspecified: Secondary | ICD-10-CM

## 2024-05-12 ENCOUNTER — Other Ambulatory Visit: Payer: Self-pay | Admitting: Endocrinology

## 2024-05-12 DIAGNOSIS — E049 Nontoxic goiter, unspecified: Secondary | ICD-10-CM

## 2024-05-16 ENCOUNTER — Ambulatory Visit
Admission: RE | Admit: 2024-05-16 | Discharge: 2024-05-16 | Disposition: A | Discharge status: Home / Self Care | Source: Ambulatory Visit | Attending: Endocrinology | Admitting: Endocrinology

## 2024-05-16 DIAGNOSIS — E049 Nontoxic goiter, unspecified: Secondary | ICD-10-CM

## 2024-05-27 ENCOUNTER — Other Ambulatory Visit: Payer: Self-pay | Admitting: Endocrinology

## 2024-05-27 DIAGNOSIS — E041 Nontoxic single thyroid nodule: Secondary | ICD-10-CM

## 2024-06-17 NOTE — Progress Notes (Signed)
 Chief Complaint: Patient was seen in consultation today for symptomatic thyroid  nodule  Referring Physician(s): Balan,Bindubal  History of Present Illness: Patricia Carson is a 68 y.o. female with a medical history significant for DM and multinodular goiter. She is familiar to IR from 2016 FNAs of two right thyroid  nodules. Both nodules returned pathology with benign findings. The patient last followed up with Dr. Tommas 05/12/24 and she complained of an increase in the size of her neck and pressure symptoms around her neck. Her last thyroid  ultrasound had been in 2023 so Dr. Tommas ordered a repeat thyroid  ultrasound. He also recommended the patient be evaluated for radiofrequency ablation. Her thyroid  ultrasound was obtained 05/16/24 and she presents today to the Interventional Radiology outpatient clinic for further discussion.    Thyroid  Symptom Score: 0-10  Thyroid  Cosmetic Score:  {thyroid  cosmetic score:27407}  Past Medical History:  Diagnosis Date   Goiter    Goiter    Mass of thigh    left   Murmur    Oligomenorrhea    Thyroid  disease    thyromegaly     Past Surgical History:  Procedure Laterality Date   LIPOMA EXCISION     LEFT THIGH   TUBAL LIGATION     WISDOM TOOTH EXTRACTION      Allergies: Patient has no known allergies.  Medications: Prior to Admission medications   Medication Sig Start Date End Date Taking? Authorizing Provider  metFORMIN (GLUCOPHAGE) 500 MG tablet TAKE TWO TABLETS BY MOUTH IN THE MORNING WITH A MEAL AND TWO TABLETS IN THE EVENING MEAL 05/25/15   [provider]     No family history on file.  Social History   Socioeconomic History   Marital status: Divorced    Spouse name: Not on file   Number of children: Not on file   Years of education: Not on file   Highest education level: Not on file  Occupational History   Not on file  Tobacco Use   Smoking status: Never   Smokeless tobacco: Never  Substance and Sexual  Activity   Alcohol use: No   Drug use: No   Sexual activity: Yes    Birth control/protection: Surgical    Comment: BTL  Other Topics Concern   Not on file  Social History Narrative   Not on file   Social Drivers of Health   Financial Resource Strain: Low Risk  (10/30/2023)   Received from [facility]   Overall Financial Resource Strain (CARDIA)    Difficulty of Paying Living Expenses: Not very hard  Food Insecurity: Patient Declined (10/30/2023)   Received from [facility]   Hunger Vital Sign    Within the past 12 months, you worried that your food would run out before you got the money to buy more.: Patient declined    Within the past 12 months, the food you bought just didn't last and you didn't have money to get more.: Patient declined  Transportation Needs: No Transportation Needs (10/30/2023)   Received from [facility] - Transportation    Lack of Transportation (Medical): No    Lack of Transportation (Non-Medical): No  Physical Activity: Unknown (10/30/2023)   Received from [facility]   Exercise Vital Sign    On average, how many days per week do you engage in moderate to strenuous exercise (like a brisk walk)?: 2 days    Minutes of Exercise per Session: Not on file  Stress: No Stress Concern Present (10/30/2023)  Received from [facility] - Occupational Stress Questionnaire    Feeling of Stress : Not at all  Social Connections: Moderately Integrated (10/30/2023)   Received from [facility]   Social Network    How would you rate your social network (family, work, friends)?: Adequate participation with social networks     Review of Systems: A 12 point ROS discussed and pertinent positives are indicated in the HPI above.  All other systems are negative.  Vital Signs: There were no vitals taken for this visit.  Physical Exam  Imaging:  Thyroid  Ultrasound 05/16/24  IMPRESSION: Stable enlarged and  heterogeneous thyroid  gland. Previously described right-sided thyroid  nodules that were sampled in 2016 no longer appear discrete by ultrasound and blend into enlarged and heterogeneous thyroid  tissue without discrete borders  Labs: 10/30/23 CBC WBC 3.4 - 10.8 x10E3/uL 3.7  RBC 3.77 - 5.28 x10E6/uL 4.57  Hemoglobin 11.1 - 15.9 g/dL 87.2  Hematocrit 65.9 - 46.6 % 39.9  MCV 79 - 97 fL 87  MCH 26.6 - 33.0 pg 27.8  MCHC 31.5 - 35.7 g/dL 68.1  RDW 88.2 - 84.5 % 13.3  Platelet Count 150 - 450 x10E3/uL 190    TFTs 10/07/22 Serum TSH 0.580 Serum free T4 n/a Serum T3 n/a Thyroperoxidase Antibody n/a Thyroglobulin Antibody n/a Calcitonin n/a   Prior Thyroid  FNA: 07/05/2015: Right Mid - [place] II 07/05/2015: Right inferior - [place] II     Assessment and Plan:  68 year old female with a history of multinodular goiter.    Thank you for this interesting consult.  I greatly enjoyed meeting [name] and look forward to participating in their care.  A copy of this report was sent to the requesting provider on this date.  Electronically Signed: Warren JONELLE Dais, NP 06/17/2024, 8:18 AM   I spent a total of  40 Minutes   in face to face in clinical consultation, greater than 50% of wh/ich was counseling/coordinating care for symptomatic thyroid  nodule

## 2024-06-20 ENCOUNTER — Ambulatory Visit
Admission: RE | Admit: 2024-06-20 | Discharge: 2024-06-20 | Disposition: A | Discharge status: Home / Self Care | Source: Ambulatory Visit | Attending: Endocrinology | Admitting: Endocrinology

## 2024-06-20 DIAGNOSIS — E041 Nontoxic single thyroid nodule: Secondary | ICD-10-CM

## 2024-06-20 HISTORY — PX: IR RADIOLOGIST EVAL & MGMT: IMG5224
